# Patient Record
Sex: Female | Born: 1935 | Race: White | Hispanic: No | Marital: Single | Smoking: Never smoker
Health system: Southern US, Community
[De-identification: ages and names within clinical notes are randomized; demographics above are authoritative.]

## PROBLEM LIST (undated history)

## (undated) DIAGNOSIS — I2510 Atherosclerotic heart disease of native coronary artery without angina pectoris: Secondary | ICD-10-CM

## (undated) DIAGNOSIS — I219 Acute myocardial infarction, unspecified: Secondary | ICD-10-CM

## (undated) DIAGNOSIS — I10 Essential (primary) hypertension: Secondary | ICD-10-CM

## (undated) DIAGNOSIS — M7072 Other bursitis of hip, left hip: Secondary | ICD-10-CM

## (undated) DIAGNOSIS — H9193 Unspecified hearing loss, bilateral: Secondary | ICD-10-CM

## (undated) DIAGNOSIS — I509 Heart failure, unspecified: Secondary | ICD-10-CM

## (undated) DIAGNOSIS — K115 Sialolithiasis: Secondary | ICD-10-CM

## (undated) DIAGNOSIS — M8580 Other specified disorders of bone density and structure, unspecified site: Secondary | ICD-10-CM

## (undated) HISTORY — DX: Other bursitis of hip, left hip: M70.72

## (undated) HISTORY — DX: Essential (primary) hypertension: I10

## (undated) HISTORY — DX: Atherosclerotic heart disease of native coronary artery without angina pectoris: I25.10

## (undated) HISTORY — DX: Other specified disorders of bone density and structure, unspecified site: M85.80

## (undated) HISTORY — DX: Unspecified hearing loss, bilateral: H91.93

## (undated) HISTORY — DX: Acute myocardial infarction, unspecified: I21.9

## (undated) HISTORY — DX: Sialolithiasis: K11.5

## (undated) HISTORY — DX: Heart failure, unspecified: I50.9

---

## 2017-01-10 ENCOUNTER — Other Ambulatory Visit: Payer: Self-pay | Admitting: Family Medicine

## 2017-01-10 DIAGNOSIS — R221 Localized swelling, mass and lump, neck: Secondary | ICD-10-CM

## 2017-01-11 ENCOUNTER — Encounter: Payer: Self-pay | Admitting: Interventional Cardiology

## 2017-01-13 NOTE — Progress Notes (Signed)
Cardiology Office Note   Date:  01/14/2017   ID:  Bethany Patterson, DOB 07-10-1929, MRN [id number]  PCP:  Dois Davenport., MD    No chief complaint on file. CAD   Wt Readings from Last 3 Encounters:  01/14/17 148 lb (67.1 kg)       History of Present Illness: Bethany Patterson is a 83 y.o. female  Who had CAD found in [place] 2013.  She was found to have a CTO of a vessel, presumably the RCA.  She was told it was the right side and bottom of the heart.  She was managed with medications.  She had never had chest pain.  She had a stress test due to back pain.  The test was abnormal and she was sent for cath.  No PCI has been done.   She moved here in 12/17.  She has felt well recently.  She has not needed any NTG.  Her activity is limited by hip bursitis.    She has no cardiac complaints but wanted to establish.     Past Medical History:  Diagnosis Date  . Acute myocardial infarction    UNSPECIFIED  . Atherosclerotic heart disease   . Bursitis of left hip   . Essential hypertension   . Heart failure (HCC)   . Other specified disorders of bone density and structure, unspecified site (CODE)   . Sialolithiasis   . Unspecified hearing loss, bilateral     History reviewed. No pertinent surgical history.   Current Outpatient Prescriptions  Medication Sig Dispense Refill  . acetaminophen (TYLENOL) 500 MG tablet Take 500 mg by mouth every 6 (six) hours as needed.    Marland Kitchen amLODipine (NORVASC) 5 MG tablet Take 5 mg by mouth daily.    Marland Kitchen aspirin 81 MG chewable tablet Chew 81 mg by mouth daily.    Marland Kitchen atorvastatin (LIPITOR) 40 MG tablet Take 40 mg by mouth daily.    Marland Kitchen b complex vitamins tablet Take 1 tablet by mouth daily.    . Calcium Carbonate-Vitamin D 500-50 MG-UNIT CAPS Take 1 capsule by mouth daily.    . carvedilol (COREG) 6.25 MG tablet Take 6.25 mg by mouth 2 (two) times daily with a meal.    . fluticasone (FLONASE) 50 MCG/ACT nasal spray Place 2 sprays into both nostrils  daily.    . isosorbide mononitrate (IMDUR) 60 MG 24 hr tablet Take 60 mg by mouth daily.    Marland Kitchen losartan (COZAAR) 100 MG tablet Take 100 mg by mouth daily.    . nitroGLYCERIN (NITROSTAT) 0.4 MG SL tablet Place 0.4 mg under the tongue every 5 (five) minutes as needed for chest pain.    . ranolazine (RANEXA) 500 MG 12 hr tablet Take 500 mg by mouth 2 (two) times daily.     No current facility-administered medications for this visit.     `Allergies:   Patient has no known allergies.    Social History:  The patient  reports that she has never smoked. She has never used smokeless tobacco. She reports that she drinks alcohol. She reports that she does not use drugs.   Family History:  The patient's family history is not on file. Father died if CHF at 63.   ROS:  Please see the history of present illness.   Otherwise, review of systems are positive for hip pain.   All other systems are reviewed and negative.    PHYSICAL EXAM: VS:  BP 134/82   Pulse 63  Resp 16   Ht  (1.676 m)   Wt 148 lb (67.1 kg)   BMI 23.89 kg/m  , BMI Body mass index is 23.89 kg/m. GEN: Well nourished, well developed, in no acute distress  HEENT: normal  Neck: no JVD, carotid bruits, or masses Cardiac: RRR; no murmurs, rubs, or gallops,no edema  Respiratory:  clear to auscultation bilaterally, normal work of breathing GI: soft, nontender, nondistended, + BS MS: no deformity or atrophy  Skin: warm and dry, no rash Neuro:  Strength and sensation are intact Psych: euthymic mood, full affect   EKG:   The ekg ordered today demonstrates NSR, nonspecific ST segment changes   Recent Labs: No results found for requested labs within last 8760 hours.   Lipid Panel No results found for: CHOL, TRIG, HDL, CHOLHDL, VLDL, LDLCALC, LDLDIRECT   Other studies Reviewed: Additional studies/ records that were reviewed today with results demonstrating: Noted from [place] reviewed.   ASSESSMENT AND PLAN:  1. CAD: Presumed  CTO of the RCA with left to right collaterals, diagnosed in [place].  Angina is controlled with medical therapy.  No need for Attempted CTO PCI at this time. Continue aggressive secondary prevention.  If she continues to do well, could consider trying to stop Ranexa. Activity is limited by hip pain.  She will let us know if she has any cardiac symptoms. In the past, she had her back and shoulder pain. 2. Hyperlipidemia: Continue lipid-lowering therapy. LDL target less than 100. 3. Hypertension: Continue current blood pressure lowering therapy.  Her blood pressure meds are also antianginal meds.   Current medicines are reviewed at length with the patient today.  The patient concerns regarding her medicines were addressed.  The following changes have been made:  No change  Labs/ tests ordered today include:  No orders of the defined types were placed in this encounter.   Recommend 150 minutes/week of aerobic exercise; try to find activities that she can do that do not bother her hip Low fat, low carb, high fiber diet recommended  Disposition:   FU in 1 year   Signed, Lance Muss, MD  01/14/2017 4:16 PM    [facility] [address] [place], [place], [place]  [address] Phone: [phone]; Fax: [phone]

## 2017-01-14 ENCOUNTER — Encounter: Payer: Self-pay | Admitting: Interventional Cardiology

## 2017-01-14 ENCOUNTER — Encounter: Payer: Self-pay | Admitting: *Deleted

## 2017-01-14 ENCOUNTER — Ambulatory Visit (INDEPENDENT_AMBULATORY_CARE_PROVIDER_SITE_OTHER): Payer: Medicare Other | Admitting: Interventional Cardiology

## 2017-01-14 VITALS — BP 134/82 | HR 63 | Resp 16 | Ht 66.0 in | Wt 148.0 lb

## 2017-01-14 DIAGNOSIS — I10 Essential (primary) hypertension: Secondary | ICD-10-CM

## 2017-01-14 DIAGNOSIS — E782 Mixed hyperlipidemia: Secondary | ICD-10-CM

## 2017-01-14 DIAGNOSIS — M7072 Other bursitis of hip, left hip: Secondary | ICD-10-CM | POA: Insufficient documentation

## 2017-01-14 DIAGNOSIS — I25118 Atherosclerotic heart disease of native coronary artery with other forms of angina pectoris: Secondary | ICD-10-CM

## 2017-01-14 DIAGNOSIS — I509 Heart failure, unspecified: Secondary | ICD-10-CM | POA: Insufficient documentation

## 2017-01-14 DIAGNOSIS — I2510 Atherosclerotic heart disease of native coronary artery without angina pectoris: Secondary | ICD-10-CM | POA: Insufficient documentation

## 2017-01-14 NOTE — Patient Instructions (Signed)
Medication Instructions:  Your physician recommends that you continue on your current medications as directed. Please refer to the Current Medication list given to you today.   Labwork: None  Testing/Procedures: None  Follow-Up: Your physician wants you to follow-up in: 1 year with Dr. Varanasi. You will receive a reminder letter in the mail two months in advance. If you don't receive a letter, please call our office to schedule the follow-up appointment.   Any Other Special Instructions Will Be Listed Below (If Applicable).     If you need a refill on your cardiac medications before your next appointment, please call your pharmacy.   

## 2017-01-16 NOTE — Addendum Note (Signed)
Addended by: Gunnar Fusi A on: 01/16/2017 04:15 PM   Modules accepted: Orders

## 2017-01-17 ENCOUNTER — Ambulatory Visit
Admission: RE | Admit: 2017-01-17 | Discharge: 2017-01-17 | Disposition: A | Discharge status: Home / Self Care | Payer: Medicare Other | Source: Ambulatory Visit | Attending: Family Medicine | Admitting: Family Medicine

## 2017-01-17 DIAGNOSIS — R221 Localized swelling, mass and lump, neck: Secondary | ICD-10-CM

## 2018-01-16 ENCOUNTER — Encounter: Payer: Self-pay | Admitting: Interventional Cardiology

## 2018-01-22 ENCOUNTER — Encounter: Payer: Self-pay | Admitting: Interventional Cardiology

## 2018-01-22 ENCOUNTER — Ambulatory Visit (INDEPENDENT_AMBULATORY_CARE_PROVIDER_SITE_OTHER): Payer: Medicare Other | Admitting: Interventional Cardiology

## 2018-01-22 VITALS — BP 130/62 | HR 65 | Ht 66.0 in | Wt 139.1 lb

## 2018-01-22 DIAGNOSIS — I10 Essential (primary) hypertension: Secondary | ICD-10-CM | POA: Diagnosis not present

## 2018-01-22 DIAGNOSIS — E782 Mixed hyperlipidemia: Secondary | ICD-10-CM | POA: Diagnosis not present

## 2018-01-22 DIAGNOSIS — I25118 Atherosclerotic heart disease of native coronary artery with other forms of angina pectoris: Secondary | ICD-10-CM | POA: Diagnosis not present

## 2018-01-22 NOTE — Progress Notes (Signed)
Cardiology Office Note   Date:  01/22/2018   ID:  Bethany Patterson, DOB 1929/07/07, MRN [id number]  PCP:  Dois Davenportichter, Karen L, MD    No chief complaint on file.  CAD  Wt Readings from Last 3 Encounters:  01/22/18 139 lb 1.9 oz (63.1 kg)  01/14/17 148 lb (67.1 kg)       History of Present Illness: Bethany RimaJoan General is a 84 y.o. female  Who had CAD found in [place] 2013.  She was found to have a CTO of a vessel, presumably the RCA.  She was told it was the right side and bottom of the heart.  She was managed with medications.  She had never had chest pain.  She had a stress test due to back pain.  The test was abnormal and she was sent for cath.  No PCI has been done.   She moved here in 12/17.    Denies : Chest pain. Dizziness. Leg edema. Nitroglycerin use. Orthopnea. Palpitations. Paroxysmal nocturnal dyspnea. Shortness of breath. Syncope.   She ran out of Ranexa and has felt fine.  Cost was an issue.    Exercise is limited by hip pain.    BP at home is controlled.    Past Medical History:  Diagnosis Date  . Acute myocardial infarction (HCC)    UNSPECIFIED  . Atherosclerotic heart disease   . Bursitis of left hip   . Essential hypertension   . Heart failure (HCC)   . Other specified disorders of bone density and structure, unspecified site (CODE)   . Sialolithiasis   . Unspecified hearing loss, bilateral     History reviewed. No pertinent surgical history.   Current Outpatient Medications  Medication Sig Dispense Refill  . acetaminophen (TYLENOL) 500 MG tablet Take 500 mg by mouth every 6 (six) hours as needed.    Marland Kitchen. amLODipine (NORVASC) 5 MG tablet Take 5 mg by mouth daily.    Marland Kitchen. aspirin 81 MG chewable tablet Chew 81 mg by mouth daily.    Marland Kitchen. atorvastatin (LIPITOR) 40 MG tablet Take 40 mg by mouth daily.    Marland Kitchen. b complex vitamins tablet Take 1 tablet by mouth daily.    . carvedilol (COREG) 6.25 MG tablet Take 6.25 mg by mouth 2 (two) times daily with a meal.    .  fluticasone (FLONASE) 50 MCG/ACT nasal spray Place 2 sprays into both nostrils daily.    . isosorbide mononitrate (IMDUR) 60 MG 24 hr tablet Take 60 mg by mouth daily.    Marland Kitchen. losartan (COZAAR) 100 MG tablet Take 100 mg by mouth daily.    . nitroGLYCERIN (NITROSTAT) 0.4 MG SL tablet Place 0.4 mg under the tongue every 5 (five) minutes as needed for chest pain.    . ranolazine (RANEXA) 500 MG 12 hr tablet Take 500 mg by mouth 2 (two) times daily.     No current facility-administered medications for this visit.     Allergies:   Patient has no known allergies.    Social History:  The patient  reports that she has never smoked. She has never used smokeless tobacco. She reports that she drinks alcohol. She reports that she does not use drugs.   Family History:  The patient's family history includes Congestive Heart Failure in her father; Heart disease in her mother.    ROS:  Please see the history of present illness.   Otherwise, review of systems are positive for hip pain.   All other systems  are reviewed and negative.    PHYSICAL EXAM: VS:  BP 130/62   Pulse 65   Ht 5\' 6"  (1.676 m)   Wt 139 lb 1.9 oz (63.1 kg)   LMP  (LMP Unknown)   SpO2 97%   BMI 22.45 kg/m  , BMI Body mass index is 22.45 kg/m. GEN: Well nourished, well developed, in no acute distress  HEENT: normal  Neck: no JVD, carotid bruits, or masses Cardiac: RRR; no murmurs, rubs, or gallops,no edema  Respiratory:  clear to auscultation bilaterally, normal work of breathing GI: soft, nontender, nondistended, + BS MS: no deformity or atrophy  Skin: warm and dry, no rash Neuro:  Slow gait Psych: euthymic mood, full affect   EKG:   The ekg ordered today demonstrates NSR, nonspecific ST changes   Recent Labs: No results found for requested labs within last 8760 hours.   Lipid Panel No results found for: CHOL, TRIG, HDL, CHOLHDL, VLDL, LDLCALC, LDLDIRECT   Other studies Reviewed: Additional studies/ records that were  reviewed today with results demonstrating: .   ASSESSMENT AND PLAN:  1. CAD: No angina on medical therapy.  No angina off of Ranexa.  Stop Ranexa at this time since she feels well withut it.  It is also costly.   2. HTN: The current medical regimen is effective;  continue present plan and medications. 3. Hyperlipidemia: She needs her lipids checked.  Continue lipid lowering therapy.  SHe will do this with PMD.     Current medicines are reviewed at length with the patient today.  The patient concerns regarding her medicines were addressed.  The following changes have been made:  No change  Labs/ tests ordered today include:  No orders of the defined types were placed in this encounter.   Recommend 150 minutes/week of aerobic exercise Low fat, low carb, high fiber diet recommended  Disposition:   FU in 1 year   Signed, Lance Muss, MD  01/22/2018 4:24 PM    [facility] [address] [place], [place], [place]  [address] Phone: [phone]; Fax: [phone]

## 2018-01-22 NOTE — Patient Instructions (Signed)
Medication Instructions:  Your physician has recommended you make the following change in your medication:   STOP: ranexa  Labwork: Please have your labs drawn at your Primary Care Doctor's office and have them fax us the results  Testing/Procedures: None ordered  Follow-Up: Your physician wants you to follow-up in: 1 year with Dr. Eldridge DaceVaranasi. You will receive a reminder letter in the mail two months in advance. If you don't receive a letter, please call our office to schedule the follow-up appointment.   Any Other Special Instructions Will Be Listed Below (If Applicable).     If you need a refill on your cardiac medications before your next appointment, please call your pharmacy.

## 2018-02-10 ENCOUNTER — Other Ambulatory Visit: Payer: Self-pay | Admitting: Family Medicine

## 2018-02-10 DIAGNOSIS — E2839 Other primary ovarian failure: Secondary | ICD-10-CM

## 2018-04-02 ENCOUNTER — Ambulatory Visit
Admission: RE | Admit: 2018-04-02 | Discharge: 2018-04-02 | Disposition: A | Discharge status: Home / Self Care | Payer: Medicare Other | Source: Ambulatory Visit | Attending: Family Medicine | Admitting: Family Medicine

## 2018-04-02 DIAGNOSIS — E2839 Other primary ovarian failure: Secondary | ICD-10-CM

## 2018-10-17 ENCOUNTER — Other Ambulatory Visit: Payer: Self-pay | Admitting: Family Medicine

## 2018-10-17 DIAGNOSIS — M5416 Radiculopathy, lumbar region: Secondary | ICD-10-CM

## 2018-10-23 ENCOUNTER — Ambulatory Visit
Admission: RE | Admit: 2018-10-23 | Discharge: 2018-10-23 | Disposition: A | Discharge status: Home / Self Care | Payer: Medicare Other | Source: Ambulatory Visit | Attending: Family Medicine | Admitting: Family Medicine

## 2018-10-23 DIAGNOSIS — M5416 Radiculopathy, lumbar region: Secondary | ICD-10-CM

## 2019-01-15 ENCOUNTER — Telehealth: Payer: Self-pay

## 2019-01-15 NOTE — Telephone Encounter (Signed)
Pt does not have smart phone or computer available for webex visit.      Virtual Visit Pre-Appointment Phone Call  Steps For Call:  1. Confirm consent - "In the setting of the current Covid19 crisis, you are scheduled for a (phone or video) visit with your provider on (date) at (time).  Just as we do with many in-office visits, in order for you to participate in this visit, we must obtain consent.  If you'd like, I can send this to your mychart (if signed up) or email for you to review.  Otherwise, I can obtain your verbal consent now.  All virtual visits are billed to your insurance company just like a normal visit would be.  By agreeing to a virtual visit, we'd like you to understand that the technology does not allow for your provider to perform an examination, and thus may limit your provider's ability to fully assess your condition.  Finally, though the technology is pretty good, we cannot assure that it will always work on either your or our end, and in the setting of a video visit, we may have to convert it to a phone-only visit.  In either situation, we cannot ensure that we have a secure connection.  Are you willing to proceed?"  2. Give patient instructions for WebEx download to smartphone as below if video visit  3. Advise patient to be prepared with any vital sign or heart rhythm information, their current medicines, and a piece of paper and pen handy for any instructions they may receive the day of their visit  4. Inform patient they will receive a phone call 15 minutes prior to their appointment time (may be from unknown caller ID) so they should be prepared to answer  5. Confirm that appointment type is correct in Epic appointment notes (video vs telephone)    TELEPHONE CALL NOTE  Bethany Patterson has been deemed a candidate for a follow-up tele-health visit to limit community exposure during the Covid-19 pandemic. I spoke with the patient via phone to ensure availability of  phone/video source, confirm preferred email & phone number, and discuss instructions and expectations.  I reminded Bethany Patterson to be prepared with any vital sign and/or heart rhythm information that could potentially be obtained via home monitoring, at the time of her visit. I reminded Bethany Patterson to expect a phone call at the time of her visit if her visit.  Did the patient verbally acknowledge consent to treatment? yes  Clide DalesDanielle M Eriyanna Kofoed, CMA 01/15/2019 3:04 PM   DOWNLOADING THE WEBEX SOFTWARE TO SMARTPHONE  - If Apple, go to [employer] Store and type in WebEx in the search bar. Download [employer] Meetings, the blue/green circle. The app is free but as with any other app downloads, their phone may require them to verify saved payment information or Apple password. The patient does NOT have to create an account.  - If Android, ask patient to go to [employer] Play Store and type in WebEx in the search bar. Download [employer] Meetings, the blue/green circle. The app is free but as with any other app downloads, their phone may require them to verify saved payment information or Android password. The patient does NOT have to create an account.   CONSENT FOR TELE-HEALTH VISIT - PLEASE REVIEW  I hereby voluntarily request, consent and authorize [facility] and its employed or contracted physicians, physician assistants, nurse practitioners or other licensed health care professionals (the Practitioner), to provide me with telemedicine  health care services (the "Services") as deemed necessary by the treating Practitioner. I acknowledge and consent to receive the Services by the Practitioner via telemedicine. I understand that the telemedicine visit will involve communicating with the Practitioner through live audiovisual communication technology and the disclosure of certain medical information by electronic transmission. I acknowledge that I have been given the opportunity to request an in-person assessment  or other available alternative prior to the telemedicine visit and am voluntarily participating in the telemedicine visit.  I understand that I have the right to withhold or withdraw my consent to the use of telemedicine in the course of my care at any time, without affecting my right to future care or treatment, and that the Practitioner or I may terminate the telemedicine visit at any time. I understand that I have the right to inspect all information obtained and/or recorded in the course of the telemedicine visit and may receive copies of available information for a reasonable fee.  I understand that some of the potential risks of receiving the Services via telemedicine include:  Marland Kitchen Delay or interruption in medical evaluation due to technological equipment failure or disruption; . Information transmitted may not be sufficient (e.g. poor resolution of images) to allow for appropriate medical decision making by the Practitioner; and/or  . In rare instances, security protocols could fail, causing a breach of personal health information.  Furthermore, I acknowledge that it is my responsibility to provide information about my medical history, conditions and care that is complete and accurate to the best of my ability. I acknowledge that Practitioner's advice, recommendations, and/or decision may be based on factors not within their control, such as incomplete or inaccurate data provided by me or distortions of diagnostic images or specimens that may result from electronic transmissions. I understand that the practice of medicine is not an exact science and that Practitioner makes no warranties or guarantees regarding treatment outcomes. I acknowledge that I will receive a copy of this consent concurrently upon execution via email to the email address I last provided but may also request a printed copy by calling the office of [facility].    I understand that my insurance will be billed for this visit.    I have read or had this consent read to me. . I understand the contents of this consent, which adequately explains the benefits and risks of the Services being provided via telemedicine.  . I have been provided ample opportunity to ask questions regarding this consent and the Services and have had my questions answered to my satisfaction. . I give my informed consent for the services to be provided through the use of telemedicine in my medical care  By participating in this telemedicine visit I agree to the above.

## 2019-01-21 NOTE — Progress Notes (Signed)
Virtual Visit via Telephone Note   This visit type was conducted due to national recommendations for restrictions regarding the COVID-19 Pandemic (e.g. social distancing) in an effort to limit this patient's exposure and mitigate transmission in our community.  Due to her co-morbid illnesses, this patient is at least at moderate risk for complications without adequate follow up.  This format is felt to be most appropriate for this patient at this time.  The patient did not have access to video technology/had technical difficulties with video requiring transitioning to audio format only (telephone).  All issues noted in this document were discussed and addressed.  No physical exam could be performed with this format.  Please refer to the patient's chart for her  consent to telehealth for [facility].   Unable to do video due to lack of equipment with patient.   Evaluation Performed:  Follow-up visit  Date:  01/22/2019   ID:  Bethany Patterson, DOB 06-23-1929, MRN [id number]  Patient Location: Home Provider Location: Home  PCP:  Dois Davenportichter, Karen L, MD  Cardiologist:  No primary care provider on file. Lilja Soland Electrophysiologist:  None   Chief Complaint:  CAD  History of Present Illness:    Bethany RimaJoan Cong is a 85 y.o. female with CAD found in [place] 2013. She was found to have a CTO of a vessel, presumably the RCA. She was told it was the right side and bottom of the heart. She was managed with medications.  She had never had chest pain. She had a stress test due to back pain. The test was abnormal and she was sent for cath. No PCI has been done.   She moved here to [place] in 9/17.  She ran out of Ranexa and felt fine so it was not restarted.  Cost was an issue.    The patient does not have symptoms concerning for COVID-19 infection (fever, chills, cough, or new shortness of breath).   Denies : Chest pain. Dizziness. Leg edema. Nitroglycerin use. Orthopnea. Palpitations.  Paroxysmal nocturnal dyspnea. Shortness of breath. Syncope.   She continues to walk regularly.  SHe can maintain distance from other people.   She has been out of isosorbide but has not had any cardiac sx.     Past Medical History:  Diagnosis Date   Acute myocardial infarction [facility](HCC)    UNSPECIFIED   Atherosclerotic heart disease    Bursitis of left hip    Essential hypertension    Heart failure (HCC)    Other specified disorders of bone density and structure, unspecified site (CODE)    Sialolithiasis    Unspecified hearing loss, bilateral    No past surgical history on file.   Current Meds  Medication Sig   acetaminophen (TYLENOL) 500 MG tablet Take 500 mg by mouth every 6 (six) hours as needed.   amLODipine (NORVASC) 5 MG tablet Take 5 mg by mouth daily.   aspirin 81 MG chewable tablet Chew 81 mg by mouth daily.   atorvastatin (LIPITOR) 40 MG tablet Take 40 mg by mouth daily.   b complex vitamins tablet Take 1 tablet by mouth daily.   carvedilol (COREG) 6.25 MG tablet Take 6.25 mg by mouth 2 (two) times daily with a meal.   fluticasone (FLONASE) 50 MCG/ACT nasal spray Place 2 sprays into both nostrils daily.   gabapentin (NEURONTIN) 100 MG capsule TAKE 1 CAPSULE BY MOUTH THREE TIMES DAILY AS NEEDED   losartan (COZAAR) 100 MG tablet Take 100 mg by mouth daily.  nitroGLYCERIN (NITROSTAT) 0.4 MG SL tablet Place 0.4 mg under the tongue every 5 (five) minutes as needed for chest pain.     Allergies:   Patient has no known allergies.   Social History   Tobacco Use   Smoking status: Never Smoker   Smokeless tobacco: Never Used  Substance Use Topics   Alcohol use: Yes    Comment: social   Drug use: No     Family Hx: The patient's family history includes Congestive Heart Failure in her father; Heart disease in her mother.  ROS:   Please see the history of present illness.    Weight loss All other systems reviewed and are negative.   Prior CV  studies:   The following studies were reviewed today:  Labs reviewed  Labs/Other Tests and Data Reviewed:    EKG:  NSR, NSST in 2019  Recent Labs: No results found for requested labs within last 8760 hours.   Recent Lipid Panel No results found for: CHOL, TRIG, HDL, CHOLHDL, LDLCALC, LDLDIRECT  Wt Readings from Last 3 Encounters:  01/22/19 128 lb (58.1 kg)  01/22/18 139 lb 1.9 oz (63.1 kg)  01/14/17 148 lb (67.1 kg)     Objective:    Vital Signs:  BP 140/60    Pulse (!) 50    Ht 5\' 6"  (1.676 m)    Wt 128 lb (58.1 kg)    BMI 20.66 kg/m    Well nourished, well developed female in no  acute distress. No shortness of breath noted.   Exam limited by phone format  ASSESSMENT & PLAN:    1. CAD: No angina off of Imdur.  Ok to hold this medicine.  No angina with walking.  Continue aggressive secondary prevention.  She has lost some weight recently.   2. HTN: The current medical regimen is effective;  continue present plan and medications. 3. Hyperlipidemia: LDL 54 in 2019, May.  4. Ok to take Gabapentin- for presumed neuropathy.   COVID-19 Education: The signs and symptoms of COVID-19 were discussed with the patient and how to seek care for testing (follow up with PCP or arrange E-visit).  The importance of social distancing was discussed today.  Time:   Today, I have spent 15 minutes with the patient with telehealth technology discussing the above problems.     Medication Adjustments/Labs and Tests Ordered: Current medicines are reviewed at length with the patient today.  Concerns regarding medicines are outlined above.   Tests Ordered: No orders of the defined types were placed in this encounter.   Medication Changes: No orders of the defined types were placed in this encounter.   Disposition:  Follow up in 1 year(s)  Signed, Lance Muss, MD  01/22/2019 11:23 AM    [facility]

## 2019-01-22 ENCOUNTER — Encounter: Payer: Self-pay | Admitting: Interventional Cardiology

## 2019-01-22 ENCOUNTER — Other Ambulatory Visit: Payer: Self-pay

## 2019-01-22 ENCOUNTER — Telehealth (INDEPENDENT_AMBULATORY_CARE_PROVIDER_SITE_OTHER): Payer: Medicare Other | Admitting: Interventional Cardiology

## 2019-01-22 VITALS — BP 140/60 | HR 50 | Ht 66.0 in | Wt 128.0 lb

## 2019-01-22 DIAGNOSIS — I10 Essential (primary) hypertension: Secondary | ICD-10-CM

## 2019-01-22 DIAGNOSIS — I25118 Atherosclerotic heart disease of native coronary artery with other forms of angina pectoris: Secondary | ICD-10-CM

## 2019-01-22 DIAGNOSIS — E782 Mixed hyperlipidemia: Secondary | ICD-10-CM | POA: Diagnosis not present

## 2019-01-22 NOTE — Patient Instructions (Signed)
Medication Instructions:  Your physician recommends that you continue on your current medications as directed. Please refer to the Current Medication list given to you today.  You may continue to stay off of the isosorbide mononitrate (imdur)  If you need a refill on your cardiac medications before your next appointment, please call your pharmacy.   Lab work: None Ordered  If you have labs (blood work) drawn today and your tests are completely normal, you will receive your results only by: Marland Kitchen MyChart Message (if you have MyChart) OR . A paper copy in the mail If you have any lab test that is abnormal or we need to change your treatment, we will call you to review the results.  Testing/Procedures: None ordered  Follow-Up: At [facility], you and your health needs are our priority.  As part of our continuing mission to provide you with exceptional heart care, we have created designated Provider Care Teams.  These Care Teams include your primary Cardiologist (physician) and Advanced Practice Providers (APPs -  Physician Assistants and Nurse Practitioners) who all work together to provide you with the care you need, when you need it. . You will need a follow up appointment in 1 year.  Please call our office 2 months in advance to schedule this appointment.  You may see Everette Rank, MD or one of the following Advanced Practice Providers on your designated Care Team:   . Robbie Lis, PA-C . Dayna Dunn, PA-C . Jacolyn Reedy, PA-C  Any Other Special Instructions Will Be Listed Below (If Applicable).

## 2020-01-24 NOTE — Progress Notes (Signed)
Cardiology Office Note   Date:  01/25/2020   ID:  Chudney Scheffler, DOB 1929-05-10, MRN [id number]  PCP:  Dois Davenport, MD    No chief complaint on file.  CAD  Wt Readings from Last 3 Encounters:  01/25/20 142 lb (64.4 kg)  01/22/19 128 lb (58.1 kg)  01/22/18 139 lb 1.9 oz (63.1 kg)       History of Present Illness: Zakara Parkey is a 86 y.o. female   with CAD found in [place] 2013. She was found to have a CTO of a vessel, presumably the RCA. She was told it was the right side and bottom of the heart. She was managed with medications.  She had never had chest pain. She had a stress test due to back pain. The test was abnormal and she was sent for cath. No PCI has been done.   She moved here to [place] in 9/17.  She ran out of Ranexa and felt fine so it was not restarted. Cost was an issue. She ran out of Imdur in the past but did not have cardiac sx.   The patient does not have symptoms concerning for COVID-19 infection (fever, chills, cough, or new shortness of breath).   Since the last visit, her husband died in 02-15-23.  His health had been failing after a nephrectomy in 2020.   She had been vaccinated against COVID.    Walking has been limited by knee pain.      Past Medical History:  Diagnosis Date  . Acute myocardial infarction (HCC)    UNSPECIFIED  . Atherosclerotic heart disease   . Bursitis of left hip   . Essential hypertension   . Heart failure (HCC)   . Other specified disorders of bone density and structure, unspecified site (CODE)   . Sialolithiasis   . Unspecified hearing loss, bilateral     No past surgical history on file.   Current Outpatient Medications  Medication Sig Dispense Refill  . acetaminophen (TYLENOL) 500 MG tablet Take 500 mg by mouth every 6 (six) hours as needed.    Marland Kitchen amLODipine (NORVASC) 5 MG tablet Take 5 mg by mouth daily.    Marland Kitchen aspirin 81 MG chewable tablet Chew 81 mg by mouth daily.    Marland Kitchen atorvastatin  (LIPITOR) 40 MG tablet Take 40 mg by mouth daily.    Marland Kitchen b complex vitamins tablet Take 1 tablet by mouth daily.    . carvedilol (COREG) 6.25 MG tablet Take 6.25 mg by mouth 2 (two) times daily with a meal.    . Cholecalciferol (D3-1000) 25 MCG (1000 UT) capsule Take 4,000 Units by mouth daily.    . fluticasone (FLONASE) 50 MCG/ACT nasal spray Place 2 sprays into both nostrils daily.    Marland Kitchen gabapentin (NEURONTIN) 100 MG capsule TAKE 1 CAPSULE BY MOUTH THREE TIMES DAILY AS NEEDED    . nitroGLYCERIN (NITROSTAT) 0.4 MG SL tablet Place 0.4 mg under the tongue every 5 (five) minutes as needed for chest pain.    . traMADol (ULTRAM) 50 MG tablet TAKE 1 2 (ONE HALF) TABLET BY MOUTH THREE TIMES DAILY     No current facility-administered medications for this visit.    Allergies:   Patient has no known allergies.    Social History:  The patient  reports that she has never smoked. She has never used smokeless tobacco. She reports current alcohol use. She reports that she does not use drugs.   Family History:  The  patient's family history includes Congestive Heart Failure in her father; Heart disease in her mother.    ROS:  Please see the history of present illness.   Otherwise, review of systems are positive for knee pain.   All other systems are reviewed and negative.    PHYSICAL EXAM: VS:  BP 134/70   Pulse (!) 55   Ht 5\' 6"  (1.676 m)   Wt 142 lb (64.4 kg)   SpO2 98%   BMI 22.92 kg/m  , BMI Body mass index is 22.92 kg/m. GEN: Well nourished, well developed, in no acute distress  HEENT: normal  Neck: no JVD, carotid bruits, or masses Cardiac: RRR; no murmurs, rubs, or gallops,no edema  Respiratory:  clear to auscultation bilaterally, normal work of breathing GI: soft, nontender, nondistended, + BS MS: no deformity or atrophy  Skin: warm and dry, no rash Neuro:  Strength and sensation are intact, slow gait Psych: euthymic mood, full affect   EKG:   The ekg ordered today demonstrates NSR,  nonspecific ST changes   Recent Labs: No results found for requested labs within last 8760 hours.   Lipid Panel No results found for: CHOL, TRIG, HDL, CHOLHDL, VLDL, LDLCALC, LDLDIRECT   Other studies Reviewed: Additional studies/ records that were reviewed today with results demonstrating:2019 labs reviewed.   ASSESSMENT AND PLAN:  1. CAD: Held Imdur and Ranexa and no angina in the past.  Will refill SL NTG. No angina on medical therapy.  2. HTN: The current medical regimen is effective;  continue present plan and medications.   3. Hyperlipidemia: Continue atorvastatin.  Will get readings from Dr. Darron Doom. 4. Gabapentin for neuropathy.  She has had relief.  Knee pain per ortho.  She gets relief for with injections.  Using tramadol at times.  5. Knee pain limits her mobility.  venous insufficiency - elevate legs.  Edema is better in the morning.  Can consider compression stockings.  15-20 mm Hg pressure.   Current medicines are reviewed at length with the patient today.  The patient concerns regarding her medicines were addressed.  The following changes have been made:  No change  Labs/ tests ordered today include:  No orders of the defined types were placed in this encounter.   Recommend 150 minutes/week of aerobic exercise Low fat, low carb, high fiber diet recommended  Disposition:   FU in 1 year   Signed, Larae Grooms, MD  01/25/2020 11:35 AM    [address], [place], [place]  [address] Phone: [phone]; Fax: [phone]

## 2020-01-25 ENCOUNTER — Other Ambulatory Visit: Payer: Self-pay

## 2020-01-25 ENCOUNTER — Encounter: Payer: Self-pay | Admitting: Interventional Cardiology

## 2020-01-25 ENCOUNTER — Ambulatory Visit (INDEPENDENT_AMBULATORY_CARE_PROVIDER_SITE_OTHER): Payer: Medicare Other | Admitting: Interventional Cardiology

## 2020-01-25 VITALS — BP 134/70 | HR 55 | Ht 66.0 in | Wt 142.0 lb

## 2020-01-25 DIAGNOSIS — I10 Essential (primary) hypertension: Secondary | ICD-10-CM

## 2020-01-25 DIAGNOSIS — I25118 Atherosclerotic heart disease of native coronary artery with other forms of angina pectoris: Secondary | ICD-10-CM

## 2020-01-25 DIAGNOSIS — I872 Venous insufficiency (chronic) (peripheral): Secondary | ICD-10-CM | POA: Diagnosis not present

## 2020-01-25 DIAGNOSIS — E782 Mixed hyperlipidemia: Secondary | ICD-10-CM | POA: Diagnosis not present

## 2020-01-25 MED ORDER — NITROGLYCERIN 0.4 MG SL SUBL: 0.4000 mg | SUBLINGUAL_TABLET | SUBLINGUAL | 3 refills | Status: AC | PRN

## 2020-01-25 NOTE — Patient Instructions (Signed)
Medication Instructions:  Your physician recommends that you continue on your current medications as directed. Please refer to the Current Medication list given to you today.  *If you need a refill on your cardiac medications before your next appointment, please call your pharmacy*   Lab Work: None ordered  If you have labs (blood work) drawn today and your tests are completely normal, you will receive your results only by: Marland Kitchen MyChart Message (if you have MyChart) OR . A paper copy in the mail If you have any lab test that is abnormal or we need to change your treatment, we will call you to review the results.   Testing/Procedures: None ordered   Follow-Up: At [facility], you and your health needs are our priority.  As part of our continuing mission to provide you with exceptional heart care, we have created designated Provider Care Teams.  These Care Teams include your primary Cardiologist (physician) and Advanced Practice Providers (APPs -  Physician Assistants and Nurse Practitioners) who all work together to provide you with the care you need, when you need it.  We recommend signing up for the patient portal called "MyChart".  Sign up information is provided on this After Visit Summary.  MyChart is used to connect with patients for Virtual Visits (Telemedicine).  Patients are able to view lab/test results, encounter notes, upcoming appointments, etc.  Non-urgent messages can be sent to your provider as well.   To learn more about what you can do with MyChart, go to [email or web address].    Your next appointment:   12 month(s)  The format for your next appointment:   In Person  Provider:   You may see Lance Muss, MD or one of the following Advanced Practice Providers on your designated Care Team:    Ronie Spies, PA-C  Jacolyn Reedy, PA-C    Other Instructions Elevate your legs and wear compression stockings 10-20 mmHg to help with swelling

## 2020-06-29 IMAGING — MR MR LUMBAR SPINE W/O CM
5 series · 48 of 48 positions shown · non-contrast
Comparison: None.

CLINICAL DATA: Low back pain radiating to the left lower extremity.

EXAM:
MRI LUMBAR SPINE WITHOUT CONTRAST
TECHNIQUE: Multiplanar, multisequence MR imaging of the lumbar spine was
performed. No intravenous contrast was administered.

[Series 4: T2 · sagittal · 4.0mm · 0.88mm/px · 5 of 15 slices shown (1 of 2)]
[im 1/15]
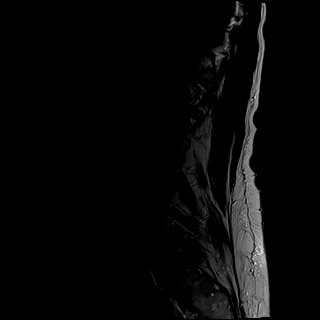
[im 4/15]
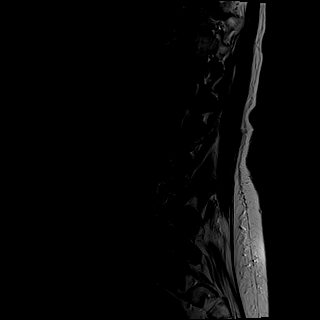
[im 8/15]
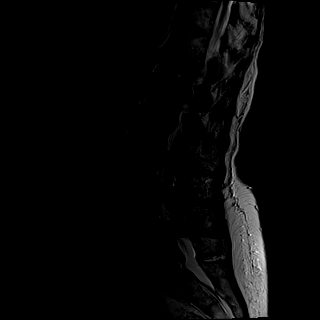
[im 11/15]
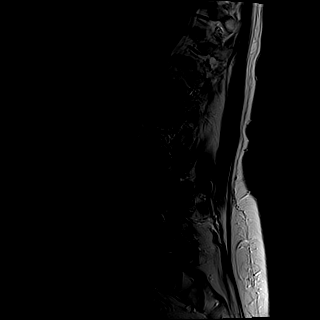
[im 15/15]
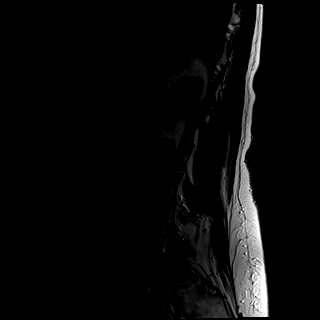

[Series 5: tirm sag · sagittal · 4.0mm · 0.88mm/px · 5 of 15 slices shown]
[im 1/15]
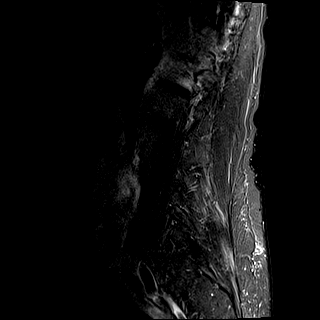
[im 4/15]
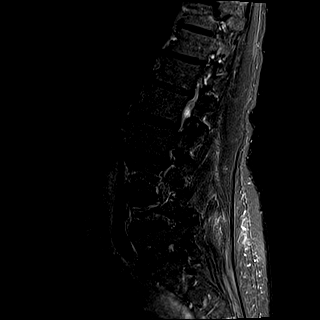
[im 8/15]
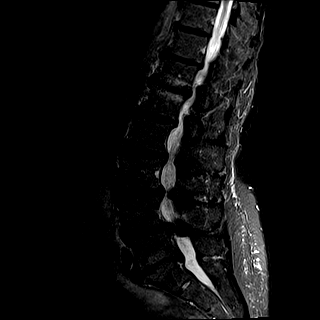
[im 11/15]
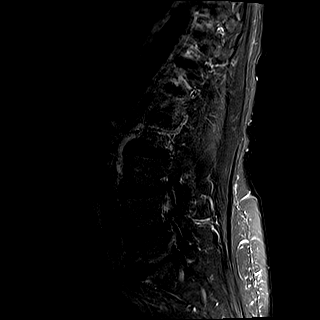
[im 15/15]
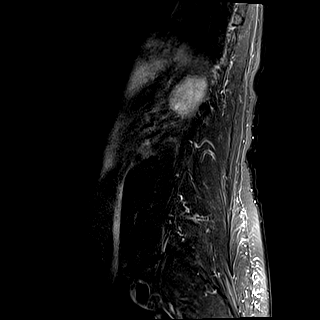

[Series 6: T1 · sagittal · 4.0mm · 0.88mm/px · 6 of 15 slices shown (1 of 2)]
[im 1/15]
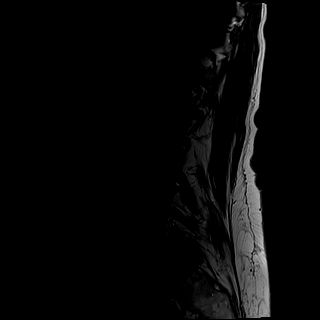
[im 3/15]
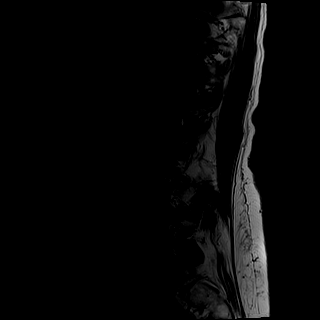
[im 6/15]
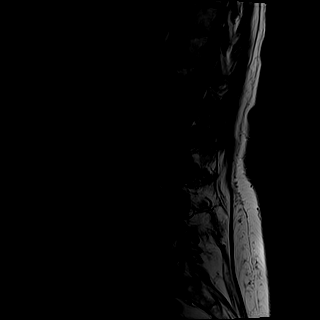
[im 9/15]
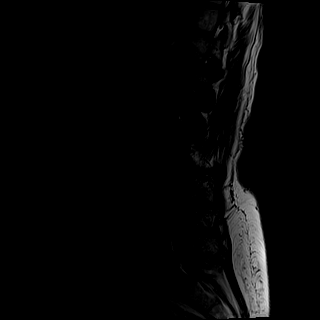
[im 12/15]
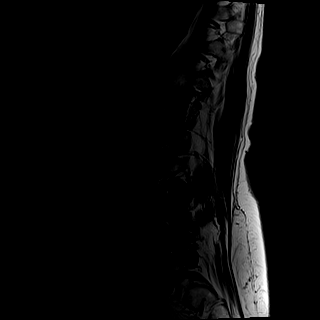
[im 15/15]
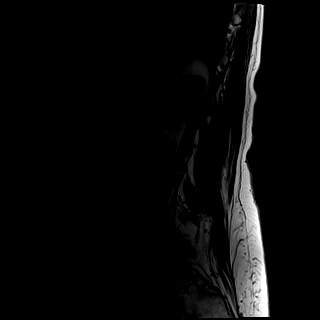

[Series 7: T2 · axial · 4.0mm · 0.70mm/px · z∈[-78,+152]mm · 16 of 42 slices shown (2 of 2)]
[im 1/42]
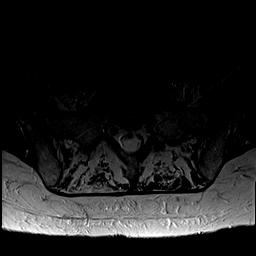
[im 3/42]
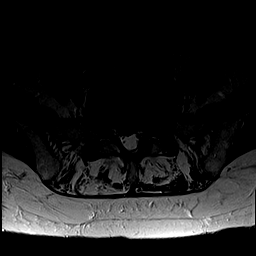
[im 6/42]
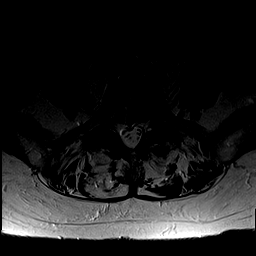
[im 9/42]
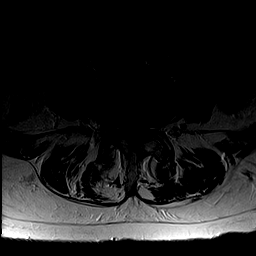
[im 11/42]
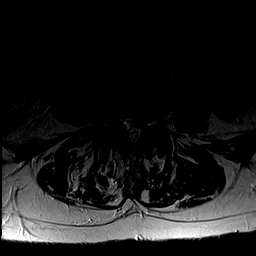
[im 14/42]
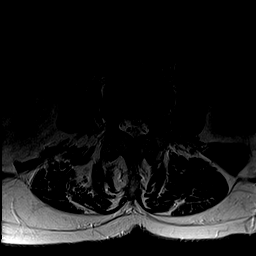
[im 17/42]
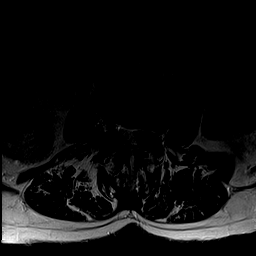
[im 20/42]
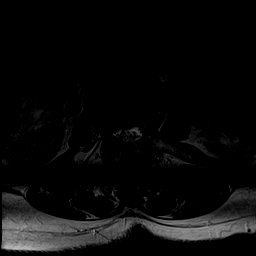
[im 22/42]
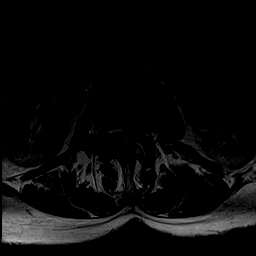
[im 25/42]
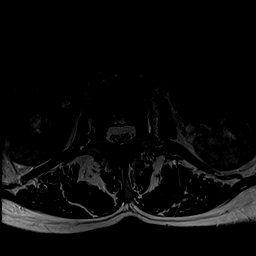
[im 28/42]
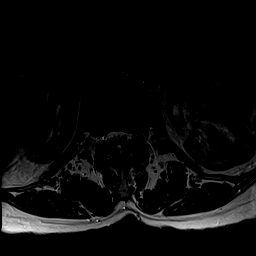
[im 31/42]
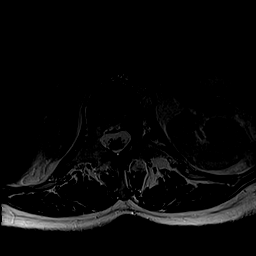
[im 33/42]
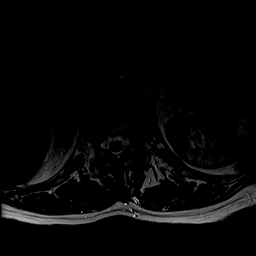
[im 36/42]
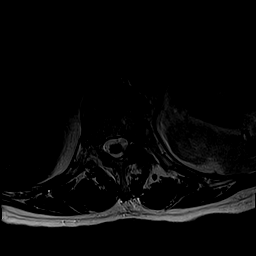
[im 39/42]
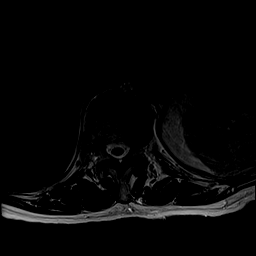
[im 42/42]
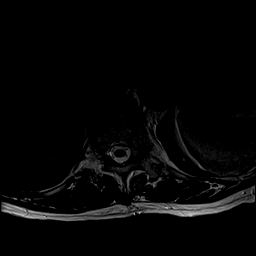

[Series 8: T1 · axial · 4.0mm · 0.70mm/px · z∈[-78,+152]mm · 16 of 42 slices shown (2 of 2)]
[im 1/42]
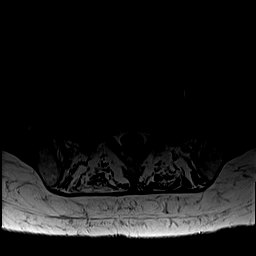
[im 3/42]
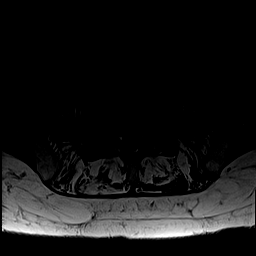
[im 6/42]
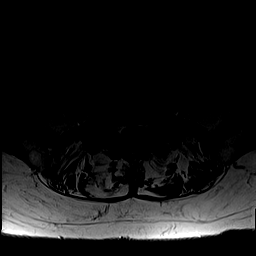
[im 9/42]
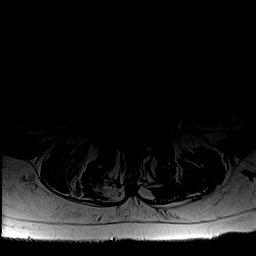
[im 11/42]
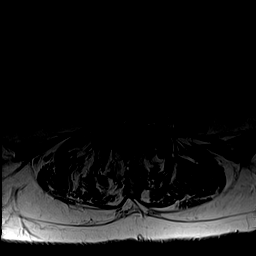
[im 14/42]
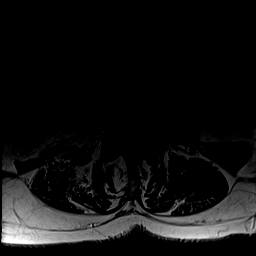
[im 17/42]
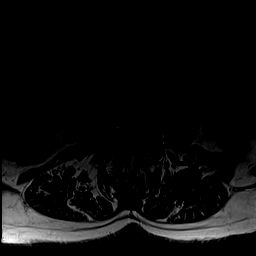
[im 20/42]
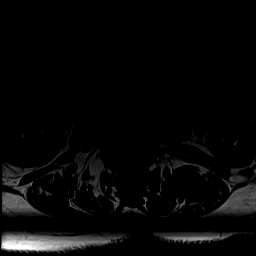
[im 22/42]
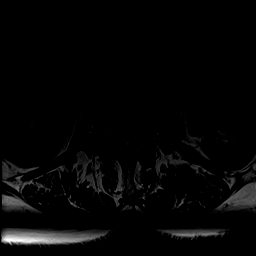
[im 25/42]
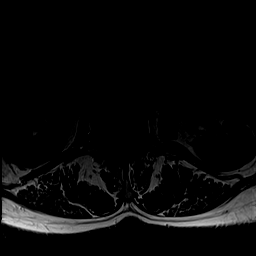
[im 28/42]
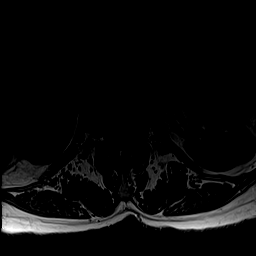
[im 31/42]
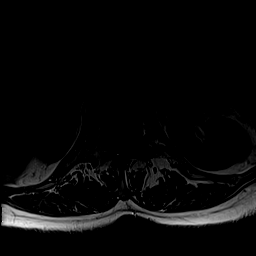
[im 33/42]
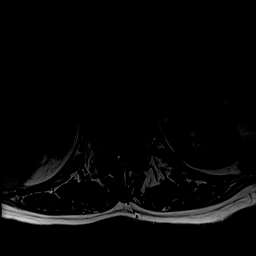
[im 36/42]
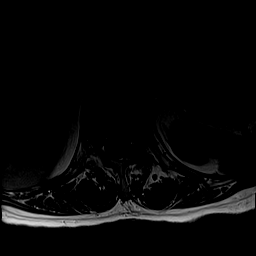
[im 39/42]
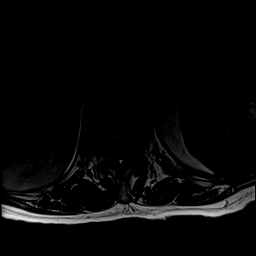
[im 42/42]
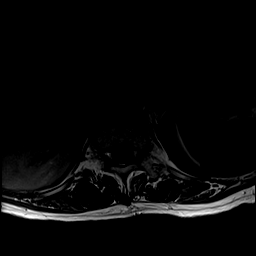

[48 of 48 positions shown; findings below may reference images not displayed]

FINDINGS: Segmentation: Normal. The lowest disc space is considered to be
L5-S1.

Alignment:  Grade 1 anterolisthesis at L4-L5

Vertebrae: There are Modic type 1 degenerative endplate signal
chances at T12-L1. No compression fracture or discitis
osteomyelitis.

Conus medullaris and cauda equina: The conus medullaris terminates
at the L2 level. The cauda equina and conus medullaris are both
normal.

Paraspinal and other soft tissues: The visualized retroperitoneal
organs and paraspinal soft tissues are normal.

Disc levels: Sagittal plane imaging includes the T10-11 disc level
through the upper sacrum, with axial imaging of the T11-12 to L5-S1
disc levels.

T10-11: Disc space narrowing without herniation. No spinal canal or
neural foraminal stenosis.

T11-12: Disc desiccation and disc space narrowing with small bulge.
No spinal canal stenosis. Mild bilateral foraminal stenosis.

T12-L1: Left-greater-than-right mild facet hypertrophy. Disc
desiccation with small bulge. Moderate left neural foraminal
stenosis. No central spinal canal or right foraminal stenosis.

L1-2: Disc desiccation and minimal bulge. Mild facet hypertrophy. No
spinal canal stenosis. Mild left neural foraminal stenosis.

L2-3: Disc desiccation with intermediate diffuse bulge and moderate
right facet hypertrophy with ligamentum flavum redundancy. Moderate
spinal canal stenosis. Moderate bilateral neural foraminal stenosis.

L3-4: Disc desiccation with intermediate bulge and moderate facet
hypertrophy. Severe spinal canal stenosis. Moderate bilateral
foraminal stenosis.

L4-5: Large disc bulge with severe facet hypertrophy. Severe spinal
canal stenosis with impingement of the cauda equina nerve roots.
Severe right neural foraminal stenosis and mild left foraminal
narrowing.

L5-S1: Small central disc extrusion with inferior migration. Right
lateral recess narrowing with minimal displacement of the descending
right S1 nerve root. No central spinal canal stenosis.
Moderate-to-severe bilateral neural foraminal stenosis.

The visualized portion of the sacrum is normal.
IMPRESSION: 1. Severe spinal canal stenosis at L3-4 and L4-5 with impingement of
the cauda equina nerve roots.
2. Multilevel moderate-to-severe neural foraminal stenosis, worst at
right L4-5 and bilateral L5-S1.
3. Multilevel facet arthrosis may serve as a source of local back
pain.
4. Small central disc extrusion at L5-S1 minimally displaces the
descending right S1 nerve root. Correlate for associated
radiculopathy.

## 2021-01-18 NOTE — Progress Notes (Signed)
Cardiology Office Note   Date:  01/19/2021   ID:  Bethany Patterson, DOB 10-Feb-1929, MRN [id number]  PCP:  Bethany Davenport, MD    No chief complaint on file.  CAD  Wt Readings from Last 3 Encounters:  01/19/21 143 lb (64.9 kg)  01/25/20 142 lb (64.4 kg)  01/22/19 128 lb (58.1 kg)       History of Present Illness: Bethany Patterson is a 87 y.o. female  with CAD found in [place] 2013. She was found to have a CTO of a vessel, presumably the RCA. She was told it was the right side and bottom of the heart. She was managed with medications.  She had never had chest pain. She had a stress test due to back pain. The test was abnormal and she was sent for cath. No PCI has been done.   [place] hereto [place] 9/17.  She ran out of Ranexa and felt fineso it was not restarted. Cost was an issue.She ran out of Imdur in the past but did not have cardiac sx.   The patientdoes nothave symptoms concerning for COVID-19 infection (fever, chills, cough, or new shortness of breath).   Her husband died in 02/24/23.  His health had been failing after a nephrectomy in 2020.   She had been vaccinated against COVID.    Walking has been limited by knee pain.  She is trying to avoid surgery.  No cartilage in either knee.  Knee replacement recommended on both sides. Left worse than right.   Denies : Chest pain. Dizziness. Leg edema. Nitroglycerin use. Orthopnea. Palpitations. Paroxysmal nocturnal dyspnea. Shortness of breath. Syncope.        Past Medical History:  Diagnosis Date  . Acute myocardial infarction (HCC)    UNSPECIFIED  . Atherosclerotic heart disease   . Bursitis of left hip   . Essential hypertension   . Heart failure (HCC)   . Other specified disorders of bone density and structure, unspecified site (CODE)   . Sialolithiasis   . Unspecified hearing loss, bilateral     No past surgical history on file.   Current Outpatient Medications  Medication Sig  Dispense Refill  . acetaminophen (TYLENOL) 500 MG tablet Take 500 mg by mouth every 6 (six) hours as needed.    Marland Kitchen amLODipine (NORVASC) 5 MG tablet Take 5 mg by mouth daily.    Marland Kitchen aspirin 81 MG chewable tablet Chew 81 mg by mouth daily.    Marland Kitchen atorvastatin (LIPITOR) 40 MG tablet Take 40 mg by mouth daily.    Marland Kitchen b complex vitamins tablet Take 1 tablet by mouth daily.    . carvedilol (COREG) 6.25 MG tablet Take 6.25 mg by mouth 2 (two) times daily with a meal.    . Cholecalciferol (D3-1000) 25 MCG (1000 UT) capsule Take 4,000 Units by mouth daily.    . fluticasone (FLONASE) 50 MCG/ACT nasal spray Place 2 sprays into both nostrils daily.    Marland Kitchen gabapentin (NEURONTIN) 100 MG capsule TAKE 1 CAPSULE BY MOUTH THREE TIMES DAILY AS NEEDED    . nitroGLYCERIN (NITROSTAT) 0.4 MG SL tablet Place 1 tablet (0.4 mg total) under the tongue every 5 (five) minutes as needed for chest pain. 25 tablet 3  . traMADol (ULTRAM) 50 MG tablet TAKE 1 2 (ONE HALF) TABLET BY MOUTH THREE TIMES DAILY     No current facility-administered medications for this visit.    Allergies:   Patient has no known allergies.  Social History:  The patient  reports that she has never smoked. She has never used smokeless tobacco. She reports current alcohol use. She reports that she does not use drugs.   Family History:  The patient's family history includes Congestive Heart Failure in her father; Heart disease in her mother.    ROS:  Please see the history of present illness.   Otherwise, review of systems are positive for knee pain.   All other systems are reviewed and negative.    PHYSICAL EXAM: VS:  BP 132/64   Pulse (!) 56   Ht 5\' 7"  (1.702 m)   Wt 143 lb (64.9 kg)   BMI 22.40 kg/m  , BMI Body mass index is 22.4 kg/m. GEN: Well nourished, well developed, in no acute distress , frail HEENT: normal  Neck: no JVD, carotid bruits, or masses Cardiac: RRR; 2/6 early systolic murmurs; no rubs, or gallops,tr ankle edema  bilateral Respiratory:  clear to auscultation bilaterally, normal work of breathing GI: soft, nontender, nondistended, + BS MS: no deformity or atrophy  Skin: warm and dry, no rash Neuro:  Strength and sensation are intact Psych: euthymic mood, full affect   EKG:   The ekg ordered today demonstrates NSR, nonspecific ST changes   Recent Labs: No results found for requested labs within last 8760 hours.   Lipid Panel No results found for: CHOL, TRIG, HDL, CHOLHDL, VLDL, LDLCALC, LDLDIRECT   Other studies Reviewed: Additional studies/ records that were reviewed today with results demonstrating: prior labs reviewed.   ASSESSMENT AND PLAN:  1. CAD: No angina. Continue aggressive secondary prevention.  2. HTN: The current medical regimen is effective;  continue present plan and medications. 3. Hyperlipidemia: No recent blood work.  LDL 62 in 1/21.  HDL 119 in Jan 2021.  She is to see her PMD in June for blood test. [facility] has had high HDL per her report. 4. Knee pain: limits walking.  Elevate legs for venous insufficiency.   Current medicines are reviewed at length with the patient today.  The patient concerns regarding her medicines were addressed.  The following changes have been made:  No change  Labs/ tests ordered today include:  No orders of the defined types were placed in this encounter.   Recommend 150 minutes/week of aerobic exercise Low fat, low carb, high fiber diet recommended  Disposition:   FU in 1 year   Signed, [facility], MD  01/19/2021 11:29 AM    [facility] [address] [place], [place], [place]  [place] Phone: [phone]; Fax: [phone]

## 2021-01-19 ENCOUNTER — Encounter: Payer: Self-pay | Admitting: Interventional Cardiology

## 2021-01-19 ENCOUNTER — Other Ambulatory Visit: Payer: Self-pay

## 2021-01-19 ENCOUNTER — Ambulatory Visit (INDEPENDENT_AMBULATORY_CARE_PROVIDER_SITE_OTHER): Payer: Medicare Other | Admitting: Interventional Cardiology

## 2021-01-19 VITALS — BP 132/64 | HR 56 | Ht 67.0 in | Wt 143.0 lb

## 2021-01-19 DIAGNOSIS — I872 Venous insufficiency (chronic) (peripheral): Secondary | ICD-10-CM | POA: Diagnosis not present

## 2021-01-19 DIAGNOSIS — I25118 Atherosclerotic heart disease of native coronary artery with other forms of angina pectoris: Secondary | ICD-10-CM | POA: Diagnosis not present

## 2021-01-19 DIAGNOSIS — E782 Mixed hyperlipidemia: Secondary | ICD-10-CM | POA: Diagnosis not present

## 2021-01-19 DIAGNOSIS — I10 Essential (primary) hypertension: Secondary | ICD-10-CM | POA: Diagnosis not present

## 2021-01-19 NOTE — Patient Instructions (Signed)

## 2023-03-06 ENCOUNTER — Ambulatory Visit (INDEPENDENT_AMBULATORY_CARE_PROVIDER_SITE_OTHER): Payer: Medicare Other | Admitting: Podiatry

## 2023-03-06 DIAGNOSIS — B353 Tinea pedis: Secondary | ICD-10-CM

## 2023-03-06 DIAGNOSIS — M79674 Pain in right toe(s): Secondary | ICD-10-CM | POA: Diagnosis not present

## 2023-03-06 DIAGNOSIS — M79675 Pain in left toe(s): Secondary | ICD-10-CM

## 2023-03-06 DIAGNOSIS — B351 Tinea unguium: Secondary | ICD-10-CM | POA: Diagnosis not present

## 2023-03-06 MED ORDER — CLOTRIMAZOLE-BETAMETHASONE 1-0.05 % EX CREA: 1.0000 | TOPICAL_CREAM | Freq: Every day | CUTANEOUS | 0 refills | Status: AC

## 2023-03-06 NOTE — Progress Notes (Signed)
  Subjective:  Patient ID: Bethany Patterson, female    DOB: [birth year],  MRN: [id number]  Chief Complaint  Patient presents with   Nail Problem     Routine foot care, bilateral nail fungus     89 y.o. female returns for the above complaint.  Patient presents with thickened elongated dystrophic mycotic toenails x 10 mild pain on palpation worse with ambulation is with pressure she would like to have it debrided down.  She has secondary complaint of athlete's foot.  She is tried ketoconazole which has not helped she would like to discuss other options.  Objective:  There were no vitals filed for this visit. Podiatric Exam: Vascular: dorsalis pedis and posterior tibial pulses are palpable bilateral. Capillary return is immediate. Temperature gradient is WNL. Skin turgor WNL  Sensorium: Normal Semmes Weinstein monofilament test. Normal tactile sensation bilaterally. Nail Exam: Pt has thick disfigured discolored nails with subungual debris noted bilateral entire nail hallux through fifth toenails.  Pain on palpation to the nails. Ulcer Exam: There is no evidence of ulcer or pre-ulcerative changes or infection. Orthopedic Exam: Muscle tone and strength are WNL. No limitations in general ROM. No crepitus or effusions noted.  Skin: No Porokeratosis. No infection or ulcers.  Athlete's foot noted to left lower extremity with subjective complaint of itching and some redness.  No open wounds or lesion noted    Assessment & Plan:   1. Pain due to onychomycosis of toenails of both feet   2. Tinea pedis of left foot     Patient was evaluated and treated and all questions answered.  Onychomycosis with pain  -Nails palliatively debrided as below. -Educated on self-care  Procedure: Nail Debridement Rationale: pain  Type of Debridement: manual, sharp debridement. Instrumentation: Nail nipper, rotary burr. Number of Nails: 10  Procedures and Treatment: Consent by patient was obtained for treatment  procedures. The patient understood the discussion of treatment and procedures well. All questions were answered thoroughly reviewed. Debridement of mycotic and hypertrophic toenails, 1 through 5 bilateral and clearing of subungual debris. No ulceration, no infection noted.  Return Visit-Office Procedure: Patient instructed to return to the office for a follow up visit 3 months for continued evaluation and treatment.  Nicholes Rough, DPM    No follow-ups on file.

## 2023-06-06 ENCOUNTER — Ambulatory Visit (INDEPENDENT_AMBULATORY_CARE_PROVIDER_SITE_OTHER): Payer: Medicare Other | Admitting: Podiatry

## 2023-06-06 ENCOUNTER — Encounter: Payer: Self-pay | Admitting: Podiatry

## 2023-06-06 DIAGNOSIS — B351 Tinea unguium: Secondary | ICD-10-CM | POA: Diagnosis not present

## 2023-06-06 DIAGNOSIS — M79674 Pain in right toe(s): Secondary | ICD-10-CM

## 2023-06-06 DIAGNOSIS — M79675 Pain in left toe(s): Secondary | ICD-10-CM | POA: Diagnosis not present

## 2023-06-06 NOTE — Progress Notes (Signed)
This patient returns to my office for at risk foot care.  This patient requires this care by a professional since this patient will be at risk due to having This patient is unable to cut nails himself since the patient cannot reach his nails.These nails are painful walking and wearing shoes.  This patient presents for at risk foot care today.  General Appearance  Alert, conversant and in no acute stress.  Vascular  Dorsalis pedis and posterior tibial  pulses are palpable  bilaterally.  Capillary return is within normal limits  bilaterally. Temperature is within normal limits  bilaterally.  Neurologic  Senn-Weinstein monofilament wire test within normal limits  bilaterally. Muscle power within normal limits bilaterally.  Nails Thick disfigured discolored nails with subungual debris  from hallux to fifth toes bilaterally. No evidence of bacterial infection or drainage bilaterally.  Orthopedic  No limitations of motion  feet .  No crepitus or effusions noted.  No bony pathology or digital deformities noted.  Skin  normotropic skin with no porokeratosis noted bilaterally.  No signs of infections or ulcers noted.     Onychomycosis  Pain in right toes  Pain in left toes  Consent was obtained for treatment procedures.   Mechanical debridement of nails 1-5  bilaterally performed with a nail nipper.  Filed with dremel without incident.    Return office visit     3 months                Told patient to return for periodic foot care and evaluation due to potential at risk complications.   Gregory Mayer DPM  

## 2023-09-11 ENCOUNTER — Encounter: Payer: Self-pay | Admitting: Podiatry

## 2023-09-11 ENCOUNTER — Ambulatory Visit (INDEPENDENT_AMBULATORY_CARE_PROVIDER_SITE_OTHER): Payer: Medicare Other | Admitting: Podiatry

## 2023-09-11 DIAGNOSIS — B351 Tinea unguium: Secondary | ICD-10-CM | POA: Diagnosis not present

## 2023-09-11 DIAGNOSIS — M79674 Pain in right toe(s): Secondary | ICD-10-CM | POA: Diagnosis not present

## 2023-09-11 DIAGNOSIS — M79675 Pain in left toe(s): Secondary | ICD-10-CM | POA: Diagnosis not present

## 2023-09-11 NOTE — Progress Notes (Signed)
This patient returns to my office for at risk foot care.  This patient requires this care by a professional since this patient will be at risk due to having This patient is unable to cut nails himself since the patient cannot reach his nails.These nails are painful walking and wearing shoes.  This patient presents for at risk foot care today.  General Appearance  Alert, conversant and in no acute stress.  Vascular  Dorsalis pedis and posterior tibial  pulses are palpable  bilaterally.  Capillary return is within normal limits  bilaterally. Temperature is within normal limits  bilaterally.  Neurologic  Senn-Weinstein monofilament wire test within normal limits  bilaterally. Muscle power within normal limits bilaterally.  Nails Thick disfigured discolored nails with subungual debris  from hallux to fifth toes bilaterally. No evidence of bacterial infection or drainage bilaterally.  Orthopedic  No limitations of motion  feet .  No crepitus or effusions noted.  No bony pathology or digital deformities noted.  Skin  normotropic skin with no porokeratosis noted bilaterally.  No signs of infections or ulcers noted.     Onychomycosis  Pain in right toes  Pain in left toes  Consent was obtained for treatment procedures.   Mechanical debridement of nails 1-5  bilaterally performed with a nail nipper.  Filed with dremel without incident.    Return office visit     3 months                Told patient to return for periodic foot care and evaluation due to potential at risk complications.   Aniyiah Zell DPM  

## 2023-12-10 ENCOUNTER — Encounter: Payer: Self-pay | Admitting: Podiatry

## 2023-12-10 ENCOUNTER — Ambulatory Visit (INDEPENDENT_AMBULATORY_CARE_PROVIDER_SITE_OTHER): Payer: Medicare Other | Admitting: Podiatry

## 2023-12-10 DIAGNOSIS — M79675 Pain in left toe(s): Secondary | ICD-10-CM

## 2023-12-10 DIAGNOSIS — B351 Tinea unguium: Secondary | ICD-10-CM

## 2023-12-10 DIAGNOSIS — M79674 Pain in right toe(s): Secondary | ICD-10-CM | POA: Diagnosis not present

## 2023-12-10 NOTE — Progress Notes (Signed)

## 2024-03-11 ENCOUNTER — Ambulatory Visit: Admitting: Podiatry

## 2024-03-16 ENCOUNTER — Ambulatory Visit: Admitting: Podiatry

## 2024-03-18 ENCOUNTER — Ambulatory Visit (INDEPENDENT_AMBULATORY_CARE_PROVIDER_SITE_OTHER): Admitting: Podiatry

## 2024-03-18 ENCOUNTER — Encounter: Payer: Self-pay | Admitting: Podiatry

## 2024-03-18 DIAGNOSIS — B351 Tinea unguium: Secondary | ICD-10-CM | POA: Diagnosis not present

## 2024-03-18 DIAGNOSIS — M79675 Pain in left toe(s): Secondary | ICD-10-CM

## 2024-03-18 DIAGNOSIS — M79674 Pain in right toe(s): Secondary | ICD-10-CM | POA: Diagnosis not present

## 2024-03-18 NOTE — Progress Notes (Signed)

## 2024-06-29 ENCOUNTER — Ambulatory Visit (INDEPENDENT_AMBULATORY_CARE_PROVIDER_SITE_OTHER): Admitting: Podiatry

## 2024-06-29 ENCOUNTER — Encounter: Payer: Self-pay | Admitting: Podiatry

## 2024-06-29 DIAGNOSIS — M79674 Pain in right toe(s): Secondary | ICD-10-CM | POA: Diagnosis not present

## 2024-06-29 DIAGNOSIS — M79675 Pain in left toe(s): Secondary | ICD-10-CM | POA: Diagnosis not present

## 2024-06-29 DIAGNOSIS — B351 Tinea unguium: Secondary | ICD-10-CM | POA: Diagnosis not present

## 2024-06-29 NOTE — Progress Notes (Signed)

## 2024-09-28 ENCOUNTER — Ambulatory Visit: Admitting: Podiatry

## 2024-09-28 DIAGNOSIS — M79674 Pain in right toe(s): Secondary | ICD-10-CM

## 2024-09-28 DIAGNOSIS — B351 Tinea unguium: Secondary | ICD-10-CM

## 2024-09-28 DIAGNOSIS — M79675 Pain in left toe(s): Secondary | ICD-10-CM | POA: Diagnosis not present

## 2024-09-28 NOTE — Progress Notes (Signed)

## 2024-12-28 ENCOUNTER — Ambulatory Visit: Admitting: Podiatry
